# Patient Record
Sex: Female | Born: 2000 | Hispanic: Yes | Marital: Single | Smoking: Never smoker
Health system: Southern US, Community
[De-identification: ages and names within clinical notes are randomized; demographics above are authoritative.]

## PROBLEM LIST (undated history)

## (undated) DIAGNOSIS — J45909 Unspecified asthma, uncomplicated: Secondary | ICD-10-CM

---

## 2019-01-16 ENCOUNTER — Other Ambulatory Visit: Payer: Self-pay

## 2019-01-16 ENCOUNTER — Emergency Department (HOSPITAL_COMMUNITY)
Admission: EM | Admit: 2019-01-16 | Discharge: 2019-01-17 | Disposition: A | Discharge status: Home / Self Care | Payer: Medicaid Other | Attending: Emergency Medicine | Admitting: Emergency Medicine

## 2019-01-16 ENCOUNTER — Encounter (HOSPITAL_COMMUNITY): Payer: Self-pay | Admitting: Oncology

## 2019-01-16 ENCOUNTER — Emergency Department (HOSPITAL_COMMUNITY): Payer: Medicaid Other

## 2019-01-16 DIAGNOSIS — J45909 Unspecified asthma, uncomplicated: Secondary | ICD-10-CM | POA: Diagnosis not present

## 2019-01-16 DIAGNOSIS — R079 Chest pain, unspecified: Secondary | ICD-10-CM | POA: Diagnosis present

## 2019-01-16 HISTORY — DX: Unspecified asthma, uncomplicated: J45.909

## 2019-01-16 LAB — CBC WITH DIFFERENTIAL/PLATELET
Abs Immature Granulocytes: 0.01 10*3/uL (ref 0.00–0.07)
Basophils Absolute: 0 10*3/uL (ref 0.0–0.1)
Basophils Relative: 1 %
Eosinophils Absolute: 0.3 10*3/uL (ref 0.0–0.5)
Eosinophils Relative: 4 %
HEMATOCRIT: 40.8 % (ref 36.0–46.0)
Hemoglobin: 13.1 g/dL (ref 12.0–15.0)
Immature Granulocytes: 0 %
LYMPHS ABS: 2.5 10*3/uL (ref 0.7–4.0)
LYMPHS PCT: 33 %
MCH: 28.1 pg (ref 26.0–34.0)
MCHC: 32.1 g/dL (ref 30.0–36.0)
MCV: 87.6 fL (ref 80.0–100.0)
Monocytes Absolute: 0.6 10*3/uL (ref 0.1–1.0)
Monocytes Relative: 8 %
Neutro Abs: 4.2 10*3/uL (ref 1.7–7.7)
Neutrophils Relative %: 54 %
Platelets: 366 10*3/uL (ref 150–400)
RBC: 4.66 MIL/uL (ref 3.87–5.11)
RDW: 12.5 % (ref 11.5–15.5)
WBC: 7.6 10*3/uL (ref 4.0–10.5)
nRBC: 0 % (ref 0.0–0.2)

## 2019-01-16 LAB — I-STAT TROPONIN, ED: Troponin i, poc: 0 ng/mL (ref 0.00–0.08)

## 2019-01-16 LAB — BASIC METABOLIC PANEL
Anion gap: 9 (ref 5–15)
BUN: 12 mg/dL (ref 6–20)
CO2: 22 mmol/L (ref 22–32)
Calcium: 8.9 mg/dL (ref 8.9–10.3)
Chloride: 104 mmol/L (ref 98–111)
Creatinine, Ser: 0.8 mg/dL (ref 0.44–1.00)
GFR calc Af Amer: >60 mL/min (ref >60)
Glucose, Bld: 106 mg/dL — ABNORMAL HIGH (ref 70–99)
Potassium: 3.7 mmol/L (ref 3.5–5.1)
Sodium: 135 mmol/L (ref 135–145)

## 2019-01-16 MED ORDER — LIDOCAINE VISCOUS HCL 2 % MT SOLN
15.0000 mL | Freq: Once | OROMUCOSAL | Status: AC
  Administered 2019-01-16: 15 mL via ORAL
  Filled 2019-01-16: qty 15

## 2019-01-16 MED ORDER — ALUM & MAG HYDROXIDE-SIMETH 200-200-20 MG/5ML PO SUSP
30.0000 mL | Freq: Once | ORAL | Status: AC
  Administered 2019-01-16: 30 mL via ORAL
  Filled 2019-01-16: qty 30

## 2019-01-16 NOTE — ED Provider Notes (Signed)
[facility] EMERGENCY DEPARTMENT Provider Note   CSN: [id number] Arrival date & time: 01/16/19  2130     History   Chief Complaint Chief Complaint  Patient presents with  . Chest Pain    HPI Alexandria Rangel is a 18 y.o. female.  The history is provided by the patient and medical records.  Chest Pain     18 year old female with history of asthma, presenting to the ED with chest pain.  This was present this morning upon waking and has persisted through the day.  States pain is midsternal and radiating down to her left side.  States he feels like there is liquid draining from her throat down into her chest causing pain.  She does report somewhat of a bloated sensation in some mild belching.  She is not had any shortness of breath, palpitations, dizziness, or diaphoresis.  She has no known cardiac history.  No significant family cardiac history.  She is not a smoker.  She does report some moderate alcohol consumption last night.  She is not tried any medications for symptoms prior to arrival.  Past Medical History:  Diagnosis Date  . Asthma     There are no active problems to display for this patient.   History reviewed. No pertinent surgical history.   OB History   No obstetric history on file.      Home Medications    Prior to Admission medications   Not on File    Family History No family history on file.  Social History Social History   Tobacco Use  . Smoking status: Never Smoker  . Smokeless tobacco: Never Used  Substance Use Topics  . Alcohol use: Yes  . Drug use: Never     Allergies   Patient has no known allergies.   Review of Systems Review of Systems  Cardiovascular: Positive for chest pain.  All other systems reviewed and are negative.    Physical Exam Updated Vital Signs BP 131/88 (BP Location: Right Arm)   Pulse 78   Temp 99.9 F (37.7 C) (Oral)   Resp 18   Ht 4\' 11"  (1.499 m)   Wt 90.7 kg   LMP 12/26/2018  Comment: Still spoting at this time  SpO2 100%   BMI 40.40 kg/m   Physical Exam Vitals signs and nursing note reviewed.  Constitutional:      Appearance: She is well-developed.  HENT:     Head: Normocephalic and atraumatic.  Eyes:     Conjunctiva/sclera: Conjunctivae normal.     Pupils: Pupils are equal, round, and reactive to light.  Neck:     Musculoskeletal: Normal range of motion.  Cardiovascular:     Rate and Rhythm: Normal rate and regular rhythm.     Heart sounds: Normal heart sounds.  Pulmonary:     Effort: Pulmonary effort is normal.     Breath sounds: Normal breath sounds.  Chest:     Comments: Chest wall non-tender Abdominal:     General: Bowel sounds are normal.     Palpations: Abdomen is soft.  Musculoskeletal: Normal range of motion.  Skin:    General: Skin is warm and dry.  Neurological:     Mental Status: She is alert and oriented to person, place, and time.      ED Treatments / Results  Labs (all labs ordered are listed, but only abnormal results are displayed) Labs Reviewed  BASIC METABOLIC PANEL - Abnormal; Notable for the following components:  Result Value   Glucose, Bld 106 (*)    All other components within normal limits  CBC WITH DIFFERENTIAL/PLATELET  I-STAT TROPONIN, ED    EKG EKG Interpretation  Date/Time:  Sunday January 16 2019 21:39:48 EST Ventricular Rate:  94 PR Interval:    QRS Duration: 82 QT Interval:  336 QTC Calculation: 421 R Axis:   41 Text Interpretation:  Sinus rhythm TWI in lead III No old tracing to compare No apparent ischemic changes Confirmed by Isaacs, Cameron ([id number]) on 01/17/2019 12:22:31 AM   Radiology Dg Chest 2 View  Result Date: 01/16/2019 CLINICAL DATA:  17 year old female with chest pain. EXAM: CHEST - 2 VIEW COMPARISON:  None. FINDINGS: The heart size and mediastinal contours are within normal limits. Both lungs are clear. The visualized skeletal structures are unremarkable. IMPRESSION: No  active cardiopulmonary disease. Electronically Signed   By: Arash  Radparvar M.D.   On: 01/16/2019 23:02    Procedures Procedures (including critical care time)  Medications Ordered in ED Medications  alum & mag hydroxide-simeth (MAALOX/MYLANTA) 200-200-20 MG/5ML suspension 30 mL (30 mLs Oral Given 01/16/19 2318)    And  lidocaine (XYLOCAINE) 2 % viscous mouth solution 15 mL (15 mLs Oral Given 01/16/19 2318)     Initial Impression / Assessment and Plan / ED Course  I have reviewed the triage vital signs and the nursing notes.  Pertinent labs & imaging results that were available during my care of the patient were reviewed by me and considered in my medical decision making (see chart for details).  17 year old female here with chest pain.  Reports this was present upon waking this morning.  States it feels like there is "fluid" in her chest going up and down.  She does report drinking moderate amount of alcohol last night.  She is afebrile, nontoxic in appearance.  Chest wall is nontender.  Lung sounds are clear.  She is in no acute distress.  Vitals are stable.  Suspect this may be acid reflux given her alcohol intake.  EKG is nonischemic.  Will plan for chest x-ray and basic labs.  Also given GI cocktail.  Will reassess.  Labs overall reassuring.  Chest x-ray is clear.  Patient feeling much better after GI cocktail here.  Continue to suspect this is GI related.  Lower suspicion for ACS, PE, dissection, acute cardiac event.  Will start on daily pepcid, advised dietary modifications especially limiting spicy/acidic foods.  Close follow-up with PCP.  Return here for any new/acute changes.  Final Clinical Impressions(s) / ED Diagnoses   Final diagnoses:  Chest pain, unspecified type    ED Discharge Orders         Ordered    famotidine (PEPCID) 20 MG tablet  2 times daily     01 /20/20 0015           Garlon HatchetSanders, Alexzia Kasler M, PA-C 01/17/19 Waunita [id number]    Shaune PollackIsaacs, Cameron, MD 01/17/19 [phone]

## 2019-01-16 NOTE — ED Triage Notes (Signed)
Pt presents w/ left sided CP since she woke up at 0600.  +lightheadedness.  Pt has hx of anxiety.  Pt reports ETOH consumption last night.  States it feels as if something is draining down her throat causing the pressure she feels in her chest.

## 2019-01-17 MED ORDER — FAMOTIDINE 20 MG PO TABS: 20.0000 mg | ORAL_TABLET | Freq: Two times a day (BID) | ORAL | 0 refills | Status: AC

## 2019-01-17 NOTE — Discharge Instructions (Signed)
All your cardiac tests looked great today. Take the prescribed medication as directed.  Monitor dietary intake-- especially spicy or acidic foods as these can worsen acid reflux. Follow-up with your primary care doctor. Return to the ED for new or worsening symptoms.

## 2019-10-24 ENCOUNTER — Other Ambulatory Visit: Payer: Self-pay

## 2019-10-24 ENCOUNTER — Emergency Department (HOSPITAL_COMMUNITY)
Admission: EM | Admit: 2019-10-24 | Discharge: 2019-10-25 | Discharge status: Against Medical Advice | Payer: Medicaid Other

## 2020-03-30 IMAGING — DX DG CHEST 2V
2 series · 2 of 2 positions shown · non-contrast
Comparison: None.

CLINICAL DATA: 17-year-old female with chest pain.

EXAM:
CHEST - 2 VIEW

[chest pa]
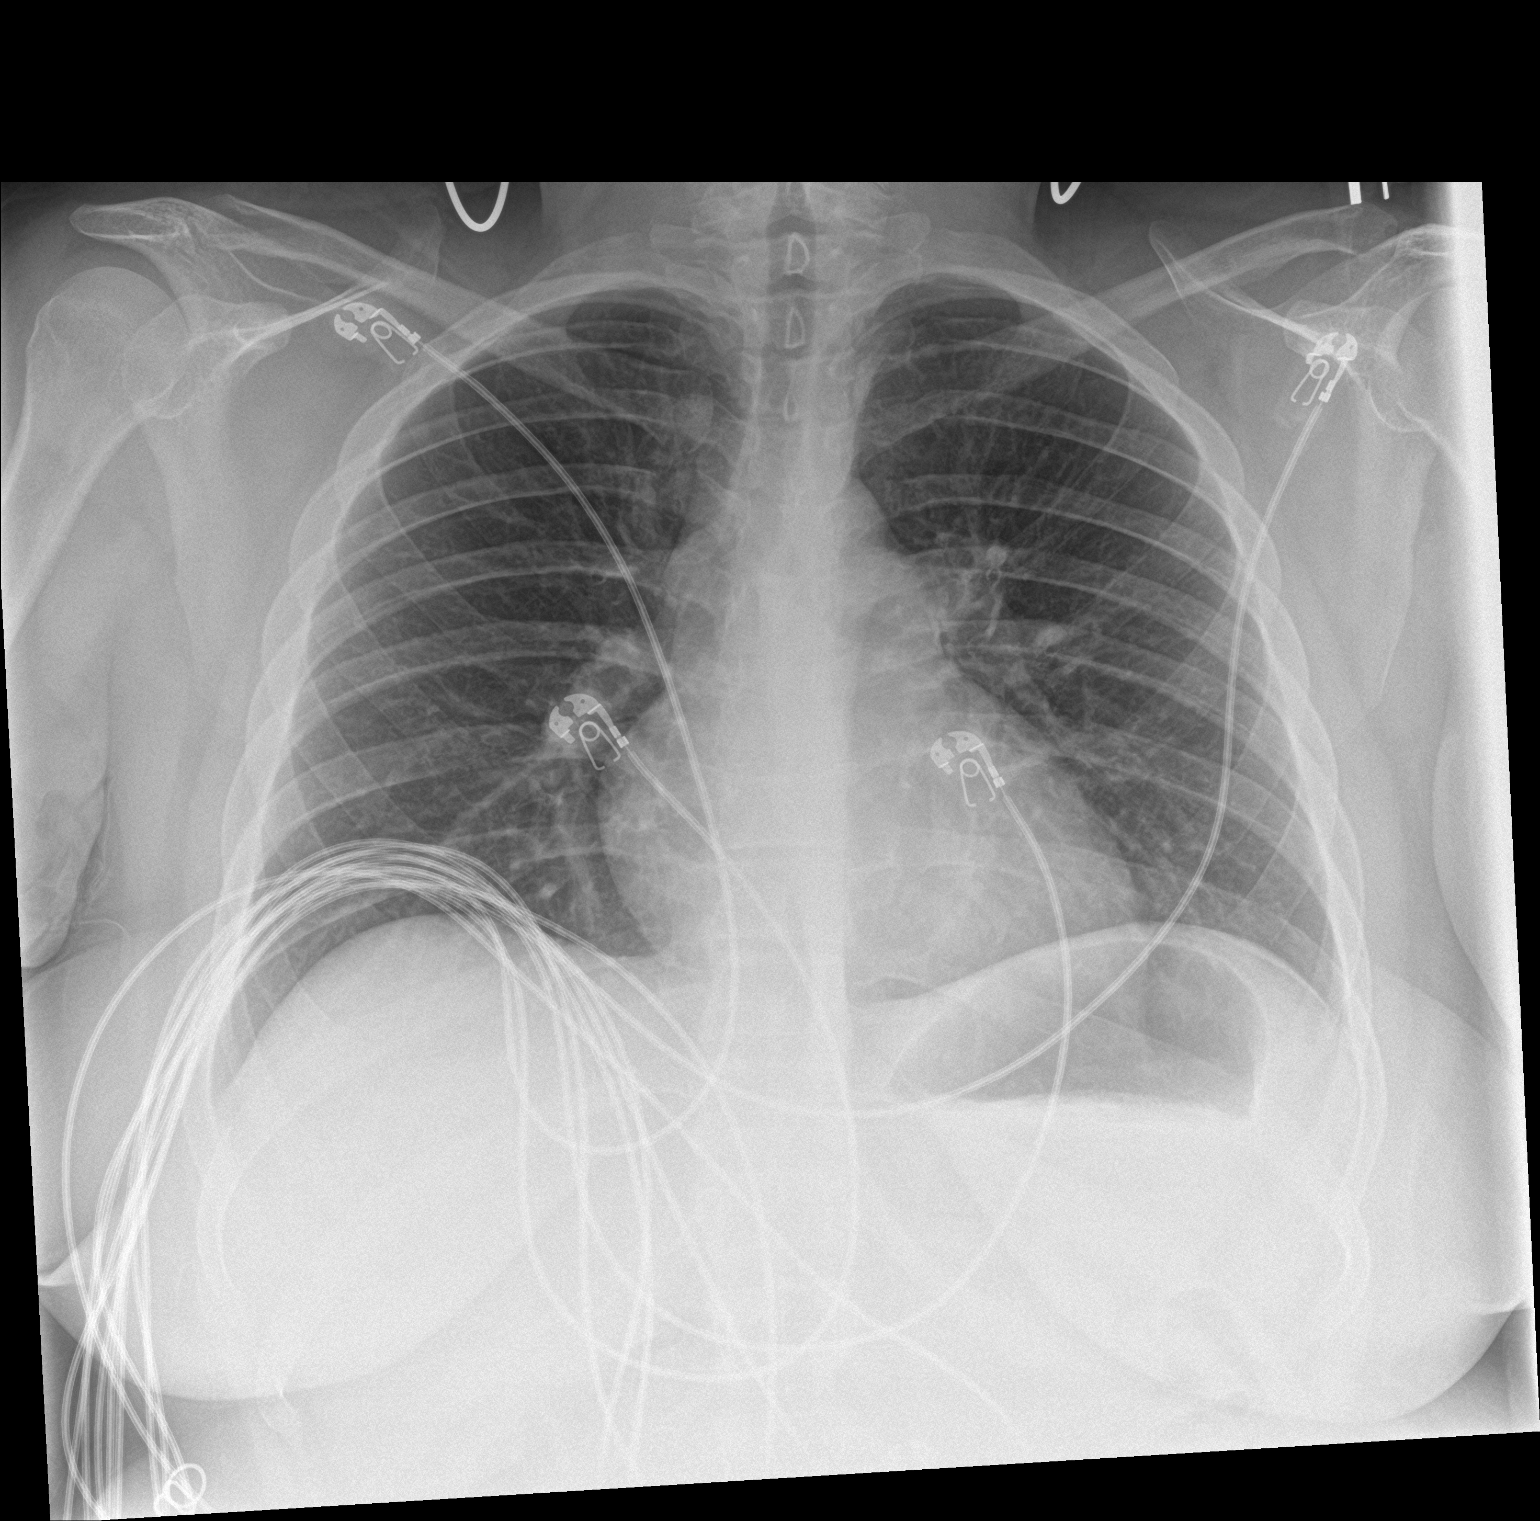

[chest lat]
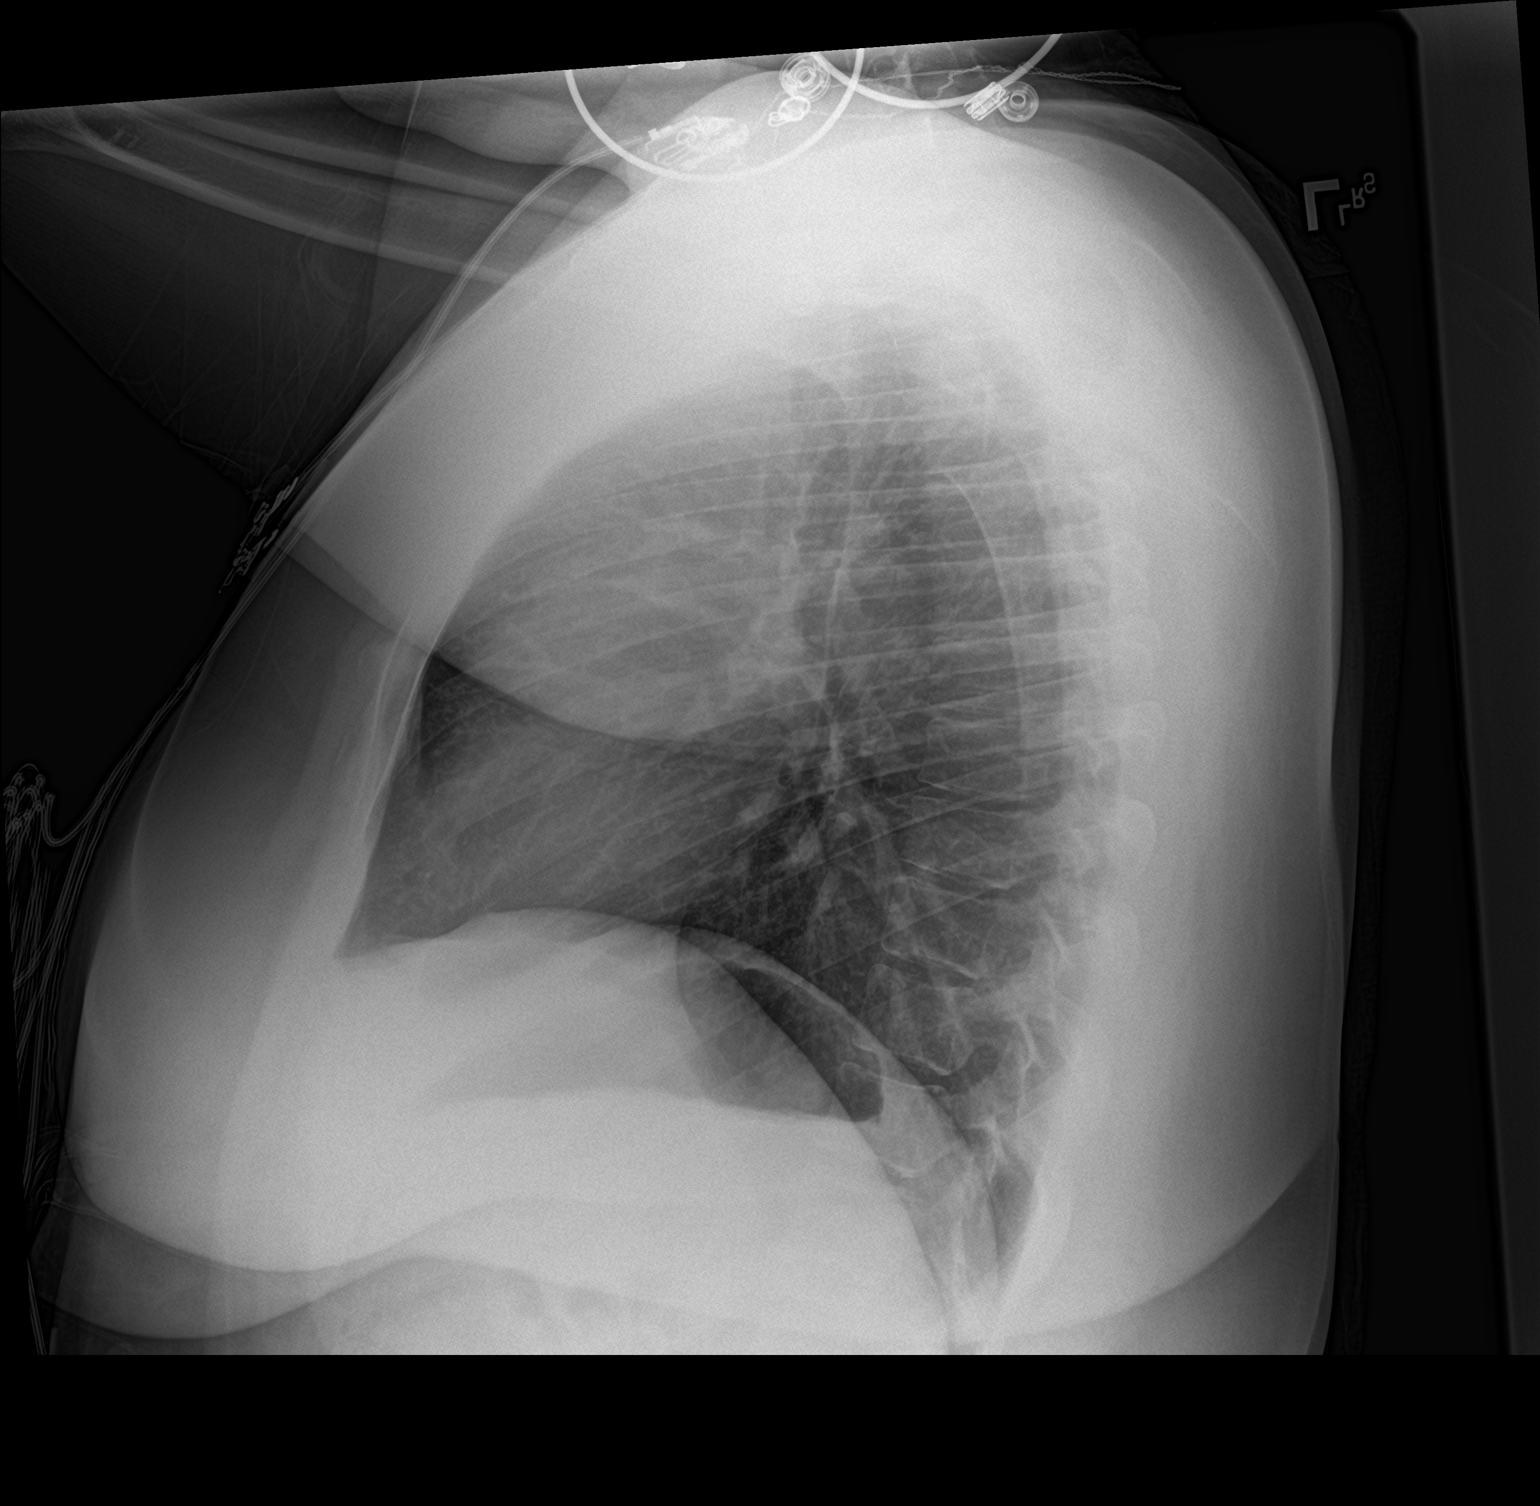

[2 of 2 positions shown; findings below may reference images not displayed]

FINDINGS: The heart size and mediastinal contours are within normal limits.
Both lungs are clear. The visualized skeletal structures are
unremarkable.
IMPRESSION: No active cardiopulmonary disease.
